# Patient Record
Sex: Female | Born: 1960 | Race: White | Hispanic: No | Marital: Married | Smoking: Never smoker
Health system: Southern US, Community
[De-identification: ages and names within clinical notes are randomized; demographics above are authoritative.]

## PROBLEM LIST (undated history)

## (undated) DIAGNOSIS — I341 Nonrheumatic mitral (valve) prolapse: Secondary | ICD-10-CM

## (undated) HISTORY — DX: Nonrheumatic mitral (valve) prolapse: I34.1

## (undated) HISTORY — PX: TUBAL LIGATION: SHX77

---

## 2015-02-04 ENCOUNTER — Ambulatory Visit (HOSPITAL_BASED_OUTPATIENT_CLINIC_OR_DEPARTMENT_OTHER)
Admission: RE | Admit: 2015-02-04 | Discharge: 2015-02-04 | Disposition: A | Discharge status: Home / Self Care | Payer: BLUE CROSS/BLUE SHIELD | Source: Ambulatory Visit | Attending: Family Medicine | Admitting: Family Medicine

## 2015-02-04 ENCOUNTER — Ambulatory Visit (INDEPENDENT_AMBULATORY_CARE_PROVIDER_SITE_OTHER): Payer: BLUE CROSS/BLUE SHIELD | Admitting: Family Medicine

## 2015-02-04 VITALS — BP 122/76 | HR 88 | Temp 97.8°F | Resp 16 | Ht 65.0 in | Wt 257.0 lb

## 2015-02-04 DIAGNOSIS — R1031 Right lower quadrant pain: Secondary | ICD-10-CM | POA: Diagnosis present

## 2015-02-04 DIAGNOSIS — R109 Unspecified abdominal pain: Secondary | ICD-10-CM

## 2015-02-04 DIAGNOSIS — K219 Gastro-esophageal reflux disease without esophagitis: Secondary | ICD-10-CM

## 2015-02-04 DIAGNOSIS — N859 Noninflammatory disorder of uterus, unspecified: Secondary | ICD-10-CM | POA: Insufficient documentation

## 2015-02-04 DIAGNOSIS — R1011 Right upper quadrant pain: Secondary | ICD-10-CM

## 2015-02-04 DIAGNOSIS — Z9049 Acquired absence of other specified parts of digestive tract: Secondary | ICD-10-CM

## 2015-02-04 DIAGNOSIS — I10 Essential (primary) hypertension: Secondary | ICD-10-CM | POA: Insufficient documentation

## 2015-02-04 LAB — POC MICROSCOPIC URINALYSIS (UMFC): Mucus: ABSENT

## 2015-02-04 LAB — POCT URINALYSIS DIP (MANUAL ENTRY)
Bilirubin, UA: NEGATIVE
Blood, UA: NEGATIVE
Glucose, UA: NEGATIVE
Ketones, POC UA: NEGATIVE
Leukocytes, UA: NEGATIVE
Nitrite, UA: NEGATIVE
Protein Ur, POC: NEGATIVE
Spec Grav, UA: <=1.005
Urobilinogen, UA: 0.2
pH, UA: 5.5

## 2015-02-04 NOTE — Patient Instructions (Addendum)
The lab tests are normal. We need to get a look at the ovary and kidney with an ultrasound.  Go to [facility] [place] for your Pelvic U/S. Register at the Emergency Department as OUTPATIENT. DO NOT REGISTER AS ED PATIENT.  [facility] [place]  [address] [place] [address] Ph# [phone]    Driving directions to [facility] [place] [id number]  - more info    [address]  [place], [place] [address]     1. Head south on [address] toward [address]      0.7 mi    2. Turn left onto [address]      0.4 mi    3. Take the 3rd right onto [address]      1.1 mi    4. Take the [address] to [facility]      0.2 mi    5. Merge [address]      3.7 mi    6. [address] toward [place]      0.3 mi    7. Keep left at the fork, follow signs for [address]      381 ft    8. Keep left at the fork, follow signs for [address]      302 ft    9. Turn left onto [address]      2.6 mi    10. Turn right onto [address]  Destination will be on the left     0.2 mi     [facility] [place]

## 2015-02-04 NOTE — Progress Notes (Signed)
   Subjective:    Patient ID: Gwendolyn Romero, female    DOB: 09/15/1960, 54 y.o.   MRN: [id number] This chart was scribed for Elvina SidleKurt Alishia Lebo, MD by Littie Deedsichard Sun, Medical Scribe. This patient was seen in Room 5 and the patient's care was started at 3:49 PM.   HPI HPI Comments: Gwendolyn Romero is a 54 y.o. female who presents to the [facility] complaining of gradual onset lower back pain that started 3 days ago. Patient reports having associated pain in her lower abdomen. She has had similar pain in the past after drinking iced tea, which usually resolves. She no longer has menstrual periods. Patient denies sore throat, headache, cough, and dysuria. PSHx includes C-section; no other abdominal surgeries.   [place]. She has a Health and safety inspectordesk job.  Review of Systems  HENT: Negative for sore throat.   Respiratory: Negative for cough.   Gastrointestinal: Positive for abdominal pain.  Genitourinary: Negative for dysuria.  Musculoskeletal: Positive for back pain.  Neurological: Negative for headaches.       Objective:   Physical Exam CONSTITUTIONAL: Well developed/well nourished HEAD: Normocephalic/atraumatic EYES: EOM/PERRL ENMT: Mucous membranes moist NECK: supple no meningeal signs SPINE: entire spine nontender CV: S1/S2 noted, no murmurs/rubs/gallops noted LUNGS: Lungs are clear to auscultation bilaterally, no apparent distress ABDOMEN: Soft with tenderness in the right lower quadrant over McBurney's point, no masses, no guarding or rebound NEURO: Pt is awake/alert, moves all extremitiesx4 EXTREMITIES: pulses normal, full ROM SKIN: warm, color normal PSYCH: no abnormalities of mood noted  Results for orders placed or performed in visit on 02/04/15  POCT urinalysis dipstick  Result Value Ref Range   Color, UA yellow yellow   Clarity, UA clear clear   Glucose, UA negative negative   Bilirubin, UA negative negative   Ketones,  POC UA negative negative   Spec Grav, UA <=1.005    Blood, UA negative negative   pH, UA 5.5    Protein Ur, POC negative negative   Urobilinogen, UA 0.2    Nitrite, UA Negative Negative   Leukocytes, UA Negative Negative  POCT Microscopic Urinalysis ([facility])  Result Value Ref Range   WBC,UR,HPF,POC None None WBC/hpf   RBC,UR,HPF,POC None None RBC/hpf   Bacteria None None   Mucus Absent Absent   Epithelial Cells, UR Per Microscopy None None cells/hpf        Assessment & Plan:   By signing my name below, I, Littie Deedsichard Sun, attest that this documentation has been prepared under the direction and in the presence of Elvina SidleKurt Marney Treloar, MD.  Electronically Signed: Littie Deedsichard Sun, Medical Scribe. 02/04/2015. 3:48 PM.  This chart was scribed in my presence and reviewed by me personally.    ICD-9-CM ICD-10-CM   1. Acute right flank pain 789.09 R10.11 POCT urinalysis dipstick   338.19  POCT Microscopic Urinalysis ([facility])  2. Essential hypertension 401.9 I10   3. S/P appendectomy V45.89 Z90.49   4. Gastroesophageal reflux disease without esophagitis 530.81 K21.9      Signed, Elvina SidleKurt Johnaton Sonneborn, MD

## 2015-02-05 ENCOUNTER — Telehealth: Payer: Self-pay

## 2015-02-05 NOTE — Telephone Encounter (Signed)
Pt is looking for ultrasound results  Please call [phone]

## 2015-02-05 NOTE — Telephone Encounter (Signed)
This is a second reading of the ultrasound. Patient has what appears to be small amount of kidney stone and she should increase her fluids. She needs to return if her pain is not resolving by tomorrow afternoon (Sunday afternoon)

## 2017-03-04 IMAGING — US US PELVIS COMPLETE
1 series · 13 of 25 positions shown · non-contrast
Comparison: No priors.

CLINICAL DATA: 54-year-old female with right lower quadrant pain
and burning while lying flat for the past 4 days. Additional
right-sided flank pain.

EXAM:
TRANSABDOMINAL AND TRANSVAGINAL ULTRASOUND OF PELVIS
TECHNIQUE: Both transabdominal and transvaginal ultrasound examinations of the
pelvis were performed. Transabdominal technique was performed for
global imaging of the pelvis including uterus, ovaries, adnexal
regions, and pelvic cul-de-sac. It was necessary to proceed with
endovaginal exam following the transabdominal exam to visualize the
endometrium and ovaries.

[Series 1: us pelvis complete · 0.25mm/px · 13 of 69 slices shown]
[im 1/69]
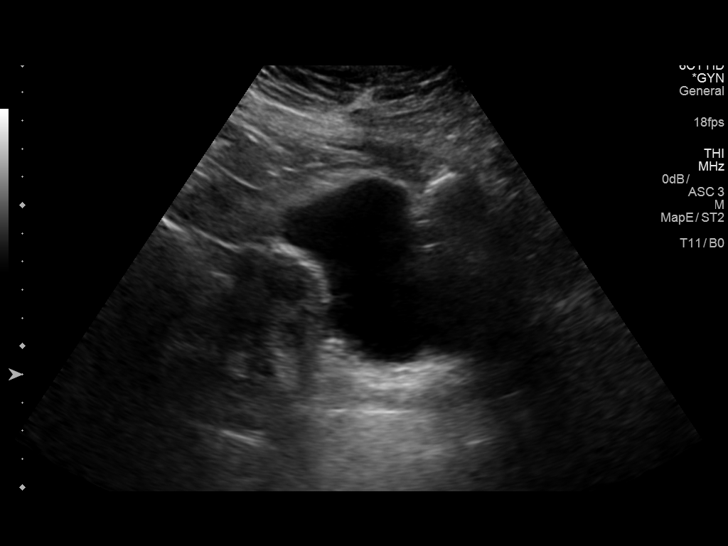
[im 6/69]
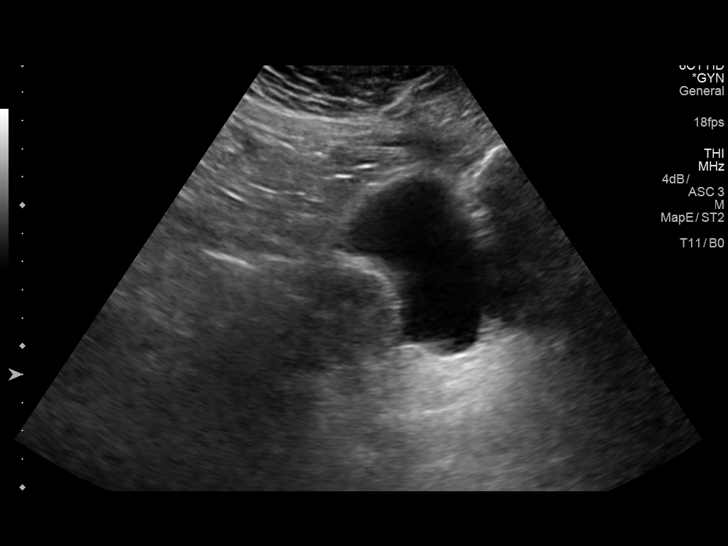
[im 12/69]
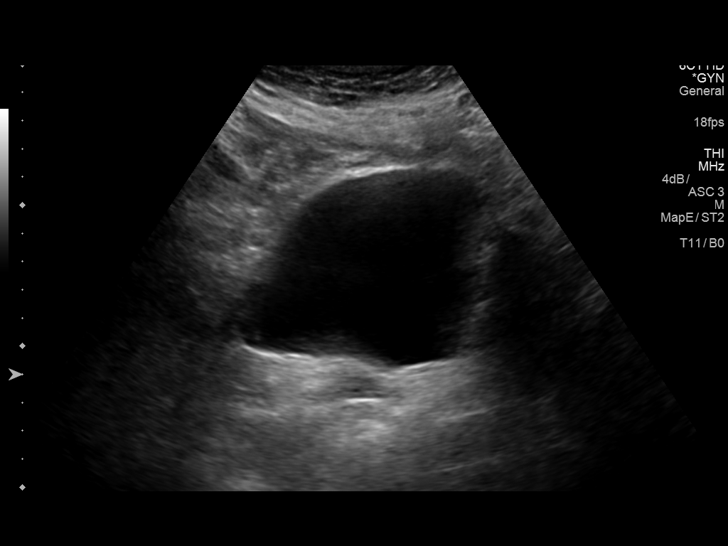
[im 18/69]
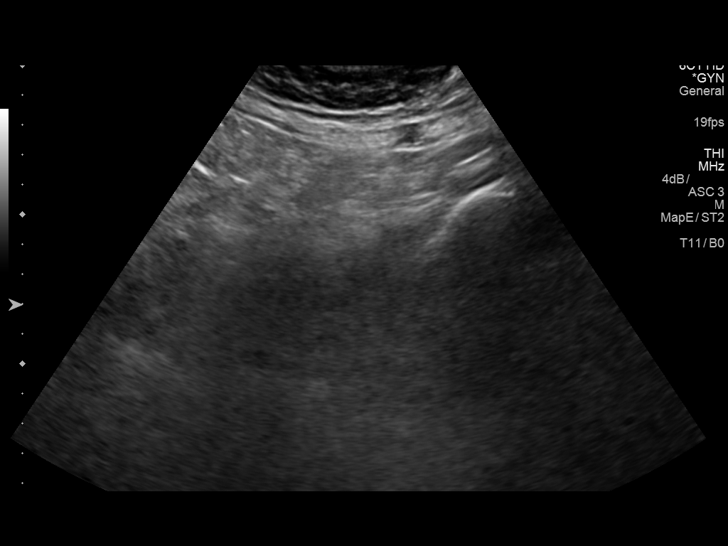
[im 23/69]
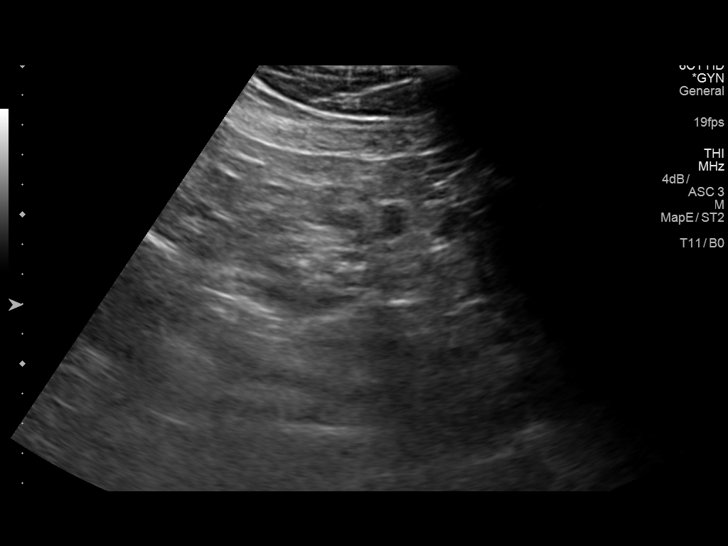
[im 29/69]
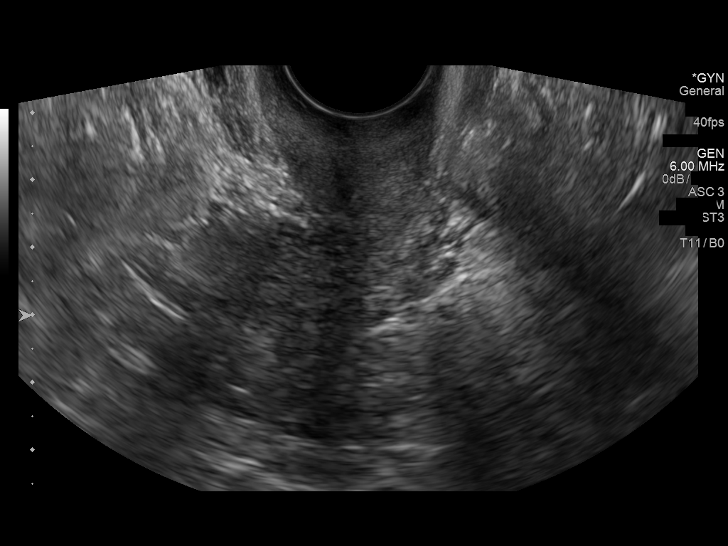
[im 35/69]
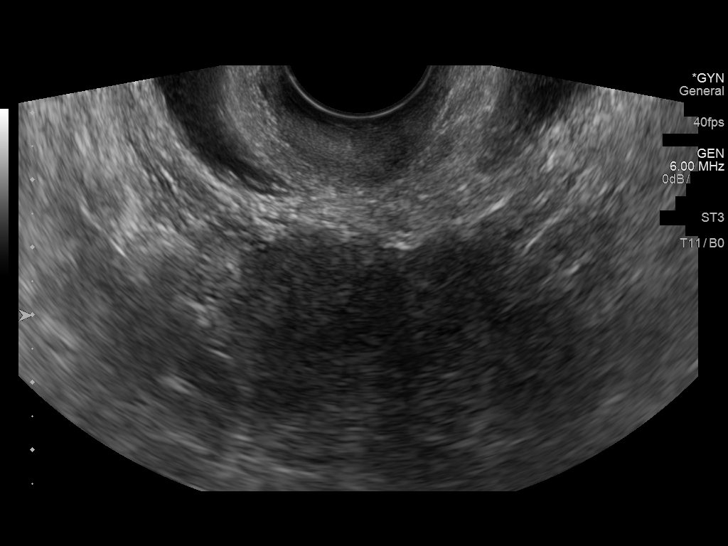
[im 40/69]
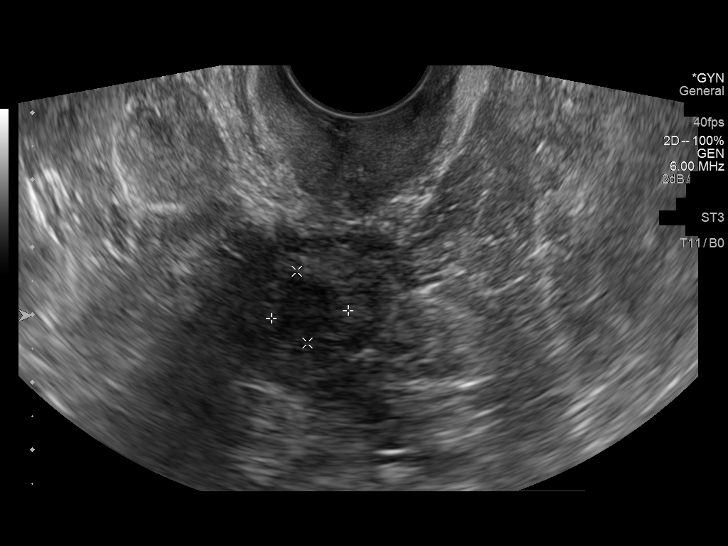
[im 46/69]
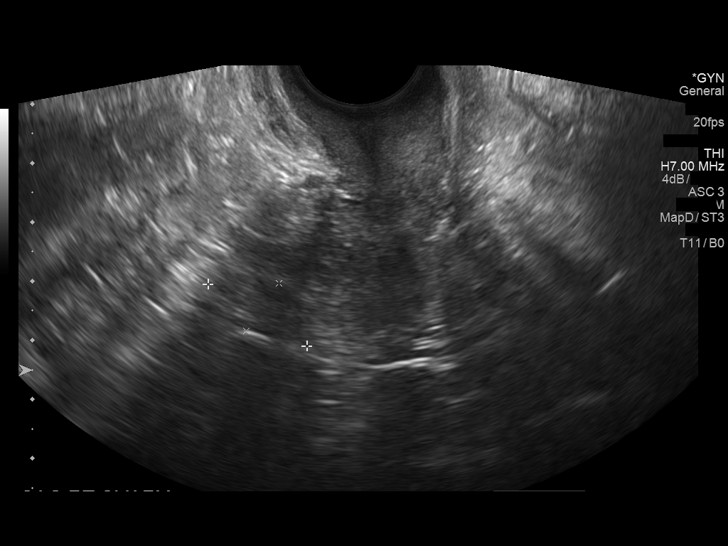
[im 52/69]
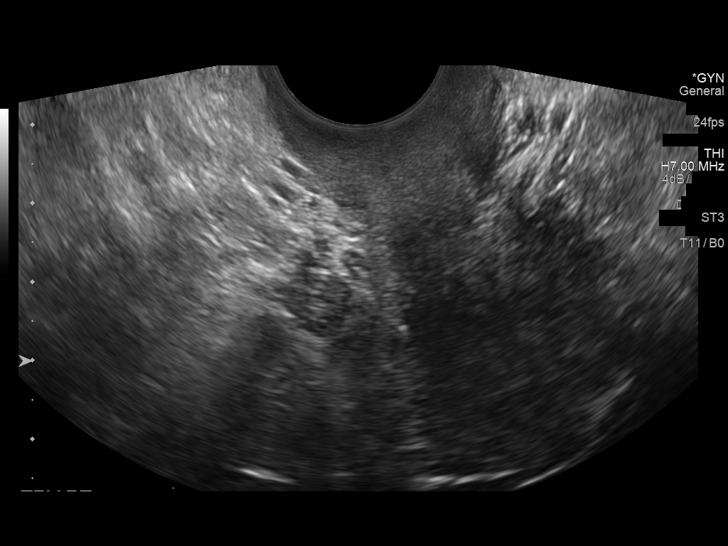
[im 57/69]
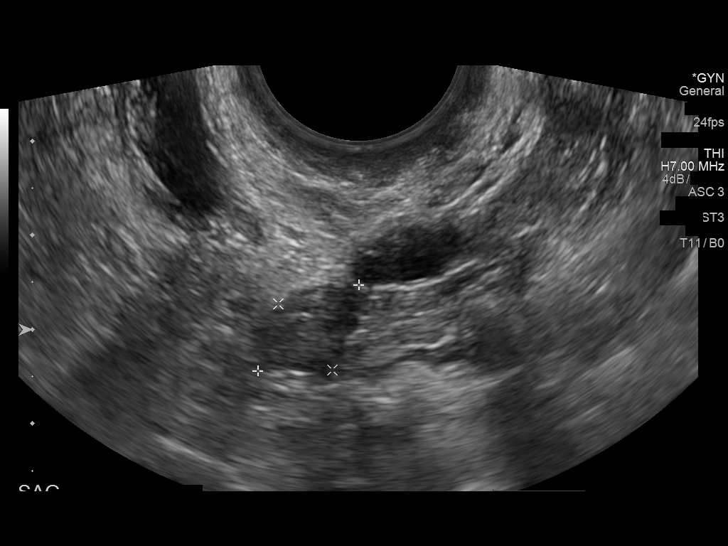
[im 63/69]
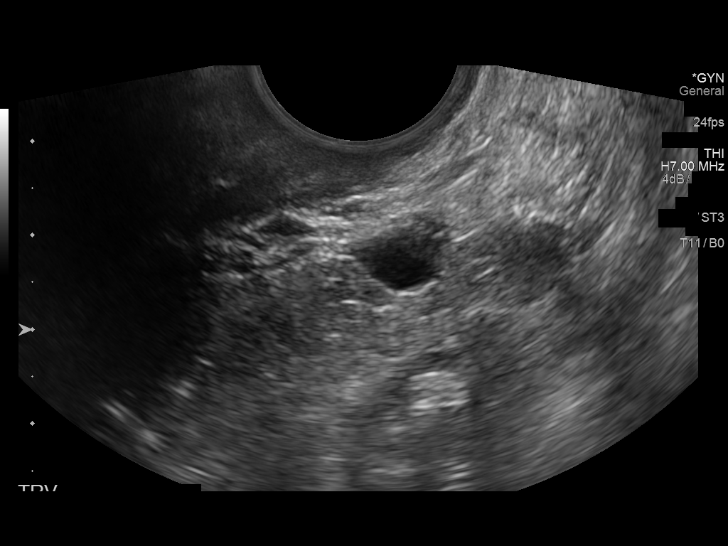
[im 69/69]
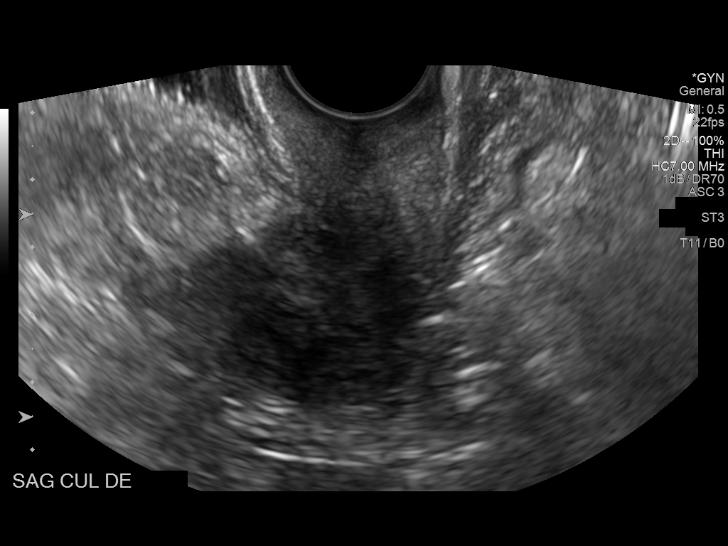

[13 of 25 positions shown; findings below may reference images not displayed]

FINDINGS: Uterus

Measurements: 6.7 x 3.5 x 5.5 cm. Heterogeneously hypoechoic area
measuring approximately 1.1 x 1.1 x 1.1 cm in the anterior aspect of
the left side of the uterine fundus is nonspecific, but likely a
small fibroid.

Endometrium

Thickness: 4.7 mm.  No focal abnormality visualized.

Right ovary

Measurements: 2.0 x 1.0 x 0.9 cm. Normal appearance/no adnexal mass.

Left ovary

Measurements: 1.4 x 0.9 x 1.0 cm. Normal appearance/no adnexal mass.

Other findings

No free fluid.
IMPRESSION: 1. No acute findings in the pelvis to account for the patient's
symptoms.
2. Small 1.1 cm hypoechoic lesion in the fundus of the uterus likely
represents a small fibroid.
